# Patient Record
Sex: Male | Born: 1940 | Race: White | Hispanic: No | Smoking: Former smoker
Health system: Southern US, Community
[De-identification: ages and names within clinical notes are randomized; demographics above are authoritative.]

## PROBLEM LIST (undated history)

## (undated) DIAGNOSIS — I10 Essential (primary) hypertension: Secondary | ICD-10-CM

## (undated) DIAGNOSIS — B029 Zoster without complications: Secondary | ICD-10-CM

---

## 1998-09-27 ENCOUNTER — Encounter: Payer: Self-pay | Admitting: General Surgery

## 1998-09-29 ENCOUNTER — Ambulatory Visit (HOSPITAL_COMMUNITY): Admission: RE | Admit: 1998-09-29 | Discharge: 1998-09-29 | Payer: Self-pay | Admitting: General Surgery

## 2000-01-24 ENCOUNTER — Ambulatory Visit (HOSPITAL_BASED_OUTPATIENT_CLINIC_OR_DEPARTMENT_OTHER): Admission: RE | Admit: 2000-01-24 | Discharge: 2000-01-24 | Payer: Self-pay | Admitting: General Surgery

## 2009-11-24 ENCOUNTER — Encounter (INDEPENDENT_AMBULATORY_CARE_PROVIDER_SITE_OTHER): Payer: Self-pay | Admitting: *Deleted

## 2010-06-05 ENCOUNTER — Emergency Department: Payer: Self-pay | Admitting: Emergency Medicine

## 2010-06-06 ENCOUNTER — Ambulatory Visit: Payer: Self-pay | Admitting: Emergency Medicine

## 2010-06-18 ENCOUNTER — Emergency Department: Payer: Self-pay | Admitting: Emergency Medicine

## 2010-06-26 NOTE — Letter (Signed)
Summary: Colonoscopy Letter  [facility]  [address] [place], [place] [address]   Phone: [phone]  Fax: [phone]      November 24, 2009 MRN: [id number]   HONG MORING [address] [place], [place]  [address]   Dear Mr. Colavito,   According to your medical record, it is time for you to schedule a Colonoscopy. The American Cancer Society recommends this procedure as a method to detect early colon cancer. Patients with a family history of colon cancer, or a personal history of colon polyps or inflammatory bowel disease are at increased risk.  This letter has beeen generated based on the recommendations made at the time of your procedure. If you feel that in your particular situation this may no longer apply, please contact our office.  Please call our office at [phone] to schedule this appointment or to update your records at your earliest convenience.  Thank you for cooperating with [place] to provide you with the very best care possible.   Sincerely,  Judie Petit T. Russella Dar, M.D.  [facility] [phone]

## 2012-01-29 ENCOUNTER — Encounter: Payer: Self-pay | Admitting: Gastroenterology

## 2018-01-09 ENCOUNTER — Emergency Department: Payer: Medicare Other

## 2018-01-09 ENCOUNTER — Inpatient Hospital Stay
Admission: EM | Admit: 2018-01-09 | Discharge: 2018-01-10 | DRG: 180 | Disposition: A | Discharge status: Other Acute Inpatient Hospital | Payer: Medicare Other | Attending: Internal Medicine | Admitting: Internal Medicine

## 2018-01-09 ENCOUNTER — Encounter: Payer: Self-pay | Admitting: Emergency Medicine

## 2018-01-09 ENCOUNTER — Other Ambulatory Visit: Payer: Self-pay

## 2018-01-09 DIAGNOSIS — J9601 Acute respiratory failure with hypoxia: Secondary | ICD-10-CM | POA: Diagnosis present

## 2018-01-09 DIAGNOSIS — E43 Unspecified severe protein-calorie malnutrition: Secondary | ICD-10-CM | POA: Diagnosis present

## 2018-01-09 DIAGNOSIS — E871 Hypo-osmolality and hyponatremia: Secondary | ICD-10-CM | POA: Diagnosis not present

## 2018-01-09 DIAGNOSIS — E860 Dehydration: Secondary | ICD-10-CM | POA: Diagnosis present

## 2018-01-09 DIAGNOSIS — Z87891 Personal history of nicotine dependence: Secondary | ICD-10-CM

## 2018-01-09 DIAGNOSIS — J9811 Atelectasis: Secondary | ICD-10-CM | POA: Diagnosis present

## 2018-01-09 DIAGNOSIS — R918 Other nonspecific abnormal finding of lung field: Secondary | ICD-10-CM | POA: Diagnosis not present

## 2018-01-09 DIAGNOSIS — J440 Chronic obstructive pulmonary disease with acute lower respiratory infection: Secondary | ICD-10-CM | POA: Diagnosis present

## 2018-01-09 DIAGNOSIS — Z8619 Personal history of other infectious and parasitic diseases: Secondary | ICD-10-CM

## 2018-01-09 DIAGNOSIS — Z79899 Other long term (current) drug therapy: Secondary | ICD-10-CM | POA: Diagnosis not present

## 2018-01-09 DIAGNOSIS — D649 Anemia, unspecified: Secondary | ICD-10-CM | POA: Diagnosis present

## 2018-01-09 DIAGNOSIS — R0902 Hypoxemia: Secondary | ICD-10-CM

## 2018-01-09 DIAGNOSIS — R634 Abnormal weight loss: Secondary | ICD-10-CM | POA: Diagnosis not present

## 2018-01-09 DIAGNOSIS — C3411 Malignant neoplasm of upper lobe, right bronchus or lung: Secondary | ICD-10-CM | POA: Diagnosis present

## 2018-01-09 DIAGNOSIS — J189 Pneumonia, unspecified organism: Secondary | ICD-10-CM | POA: Diagnosis present

## 2018-01-09 DIAGNOSIS — R042 Hemoptysis: Secondary | ICD-10-CM | POA: Diagnosis not present

## 2018-01-09 DIAGNOSIS — Z681 Body mass index (BMI) 19 or less, adult: Secondary | ICD-10-CM

## 2018-01-09 DIAGNOSIS — I10 Essential (primary) hypertension: Secondary | ICD-10-CM | POA: Diagnosis present

## 2018-01-09 HISTORY — DX: Zoster without complications: B02.9

## 2018-01-09 HISTORY — DX: Essential (primary) hypertension: I10

## 2018-01-09 LAB — BLOOD GAS, VENOUS
Acid-base deficit: 3.7 mmol/L — ABNORMAL HIGH (ref 0.0–2.0)
Bicarbonate: 20.6 mmol/L (ref 20.0–28.0)
O2 Saturation: 68.3 %
PCO2 VEN: 34 mmHg — AB (ref 44.0–60.0)
PO2 VEN: 36 mmHg (ref 32.0–45.0)
Patient temperature: 37
pH, Ven: 7.39 (ref 7.250–7.430)

## 2018-01-09 LAB — COMPREHENSIVE METABOLIC PANEL
ALBUMIN: 4.1 g/dL (ref 3.5–5.0)
ALK PHOS: 110 U/L (ref 38–126)
ALT: 28 U/L (ref 0–44)
AST: 30 U/L (ref 15–41)
Anion gap: 9 (ref 5–15)
BILIRUBIN TOTAL: 1.3 mg/dL — AB (ref 0.3–1.2)
BUN: 18 mg/dL (ref 8–23)
CALCIUM: 9.7 mg/dL (ref 8.9–10.3)
CO2: 25 mmol/L (ref 22–32)
Chloride: 93 mmol/L — ABNORMAL LOW (ref 98–111)
Creatinine, Ser: 1.08 mg/dL (ref 0.61–1.24)
GFR calc Af Amer: >60 mL/min (ref >60)
GFR calc non Af Amer: >60 mL/min (ref >60)
GLUCOSE: 118 mg/dL — AB (ref 70–99)
Potassium: 4.5 mmol/L (ref 3.5–5.1)
Sodium: 127 mmol/L — ABNORMAL LOW (ref 135–145)
Total Protein: 7.7 g/dL (ref 6.5–8.1)

## 2018-01-09 LAB — CBC WITH DIFFERENTIAL/PLATELET
Basophils Absolute: 0 10*3/uL (ref 0–0.1)
Basophils Relative: 0 %
Eosinophils Absolute: 0 10*3/uL (ref 0–0.7)
Eosinophils Relative: 0 %
HCT: 35.3 % — ABNORMAL LOW (ref 40.0–52.0)
Hemoglobin: 12.4 g/dL — ABNORMAL LOW (ref 13.0–18.0)
LYMPHS ABS: 0.3 10*3/uL — AB (ref 1.0–3.6)
LYMPHS PCT: 3 %
MCH: 31.4 pg (ref 26.0–34.0)
MCHC: 35.2 g/dL (ref 32.0–36.0)
MCV: 89.3 fL (ref 80.0–100.0)
MONO ABS: 1 10*3/uL (ref 0.2–1.0)
MONOS PCT: 13 %
Neutro Abs: 6.6 10*3/uL — ABNORMAL HIGH (ref 1.4–6.5)
Neutrophils Relative %: 84 %
PLATELETS: 262 10*3/uL (ref 150–440)
RBC: 3.95 MIL/uL — ABNORMAL LOW (ref 4.40–5.90)
RDW: 13.6 % (ref 11.5–14.5)
WBC: 7.9 10*3/uL (ref 3.8–10.6)

## 2018-01-09 LAB — URINALYSIS, COMPLETE (UACMP) WITH MICROSCOPIC
Bacteria, UA: NONE SEEN
Bilirubin Urine: NEGATIVE
Glucose, UA: >=500 mg/dL — AB
Hgb urine dipstick: NEGATIVE
Ketones, ur: 20 mg/dL — AB
Leukocytes, UA: NEGATIVE
Nitrite: NEGATIVE
PH: 6 (ref 5.0–8.0)
Protein, ur: NEGATIVE mg/dL
SQUAMOUS EPITHELIAL / LPF: NONE SEEN (ref 0–5)
Specific Gravity, Urine: 1.028 (ref 1.005–1.030)

## 2018-01-09 LAB — BRAIN NATRIURETIC PEPTIDE: B Natriuretic Peptide: 310 pg/mL — ABNORMAL HIGH (ref 0.0–100.0)

## 2018-01-09 LAB — PROCALCITONIN: Procalcitonin: <0.1 ng/mL

## 2018-01-09 LAB — LACTIC ACID, PLASMA: Lactic Acid, Venous: 1.1 mmol/L (ref 0.5–1.9)

## 2018-01-09 LAB — TROPONIN I: Troponin I: <0.03 ng/mL (ref <0.03)

## 2018-01-09 LAB — MRSA PCR SCREENING: MRSA BY PCR: NEGATIVE

## 2018-01-09 LAB — STREP PNEUMONIAE URINARY ANTIGEN: Strep Pneumo Urinary Antigen: NEGATIVE

## 2018-01-09 LAB — MAGNESIUM: Magnesium: 1.9 mg/dL (ref 1.7–2.4)

## 2018-01-09 MED ORDER — ALBUTEROL SULFATE (2.5 MG/3ML) 0.083% IN NEBU: 2.5000 mg | INHALATION_SOLUTION | RESPIRATORY_TRACT | Status: DC | PRN

## 2018-01-09 MED ORDER — IPRATROPIUM-ALBUTEROL 0.5-2.5 (3) MG/3ML IN SOLN
3.0000 mL | Freq: Four times a day (QID) | RESPIRATORY_TRACT | Status: DC
  Administered 2018-01-09 (×2): 3 mL via RESPIRATORY_TRACT
  Filled 2018-01-09 (×2): qty 3

## 2018-01-09 MED ORDER — ALBUTEROL SULFATE (2.5 MG/3ML) 0.083% IN NEBU
2.5000 mg | INHALATION_SOLUTION | Freq: Once | RESPIRATORY_TRACT | Status: AC
  Administered 2018-01-09: 2.5 mg via RESPIRATORY_TRACT
  Filled 2018-01-09: qty 3

## 2018-01-09 MED ORDER — ONDANSETRON HCL 4 MG/2ML IJ SOLN: 4.0000 mg | Freq: Four times a day (QID) | INTRAMUSCULAR | Status: DC | PRN

## 2018-01-09 MED ORDER — SODIUM CHLORIDE 0.9% FLUSH: 3.0000 mL | INTRAVENOUS | Status: DC | PRN

## 2018-01-09 MED ORDER — MORPHINE SULFATE (PF) 4 MG/ML IV SOLN
4.0000 mg | Freq: Once | INTRAVENOUS | Status: AC
  Administered 2018-01-09: 4 mg via INTRAVENOUS
  Filled 2018-01-09: qty 1

## 2018-01-09 MED ORDER — BISACODYL 5 MG PO TBEC: 5.0000 mg | DELAYED_RELEASE_TABLET | Freq: Every day | ORAL | Status: DC | PRN

## 2018-01-09 MED ORDER — ENOXAPARIN SODIUM 40 MG/0.4ML ~~LOC~~ SOLN: 40.0000 mg | SUBCUTANEOUS | Status: DC

## 2018-01-09 MED ORDER — VANCOMYCIN HCL IN DEXTROSE 1-5 GM/200ML-% IV SOLN
1000.0000 mg | Freq: Once | INTRAVENOUS | Status: AC
  Administered 2018-01-09: 1000 mg via INTRAVENOUS
  Filled 2018-01-09: qty 200

## 2018-01-09 MED ORDER — SODIUM CHLORIDE 0.9 % IV SOLN
1.0000 g | INTRAVENOUS | Status: DC
  Filled 2018-01-09: qty 10

## 2018-01-09 MED ORDER — SODIUM CHLORIDE 0.9 % IV SOLN
INTRAVENOUS | Status: DC
  Administered 2018-01-09: 16:00:00 via INTRAVENOUS

## 2018-01-09 MED ORDER — ORAL CARE MOUTH RINSE: 15.0000 mL | Freq: Two times a day (BID) | OROMUCOSAL | Status: DC

## 2018-01-09 MED ORDER — METHYLPREDNISOLONE SODIUM SUCC 125 MG IJ SOLR
60.0000 mg | Freq: Two times a day (BID) | INTRAMUSCULAR | Status: DC
  Administered 2018-01-09: 60 mg via INTRAVENOUS
  Filled 2018-01-09: qty 2

## 2018-01-09 MED ORDER — ACETAMINOPHEN 325 MG PO TABS: 650.0000 mg | ORAL_TABLET | Freq: Four times a day (QID) | ORAL | Status: DC | PRN

## 2018-01-09 MED ORDER — ACETAMINOPHEN 650 MG RE SUPP: 650.0000 mg | Freq: Four times a day (QID) | RECTAL | Status: DC | PRN

## 2018-01-09 MED ORDER — ONDANSETRON HCL 4 MG PO TABS: 4.0000 mg | ORAL_TABLET | Freq: Four times a day (QID) | ORAL | Status: DC | PRN

## 2018-01-09 MED ORDER — SENNOSIDES-DOCUSATE SODIUM 8.6-50 MG PO TABS: 1.0000 | ORAL_TABLET | Freq: Every evening | ORAL | Status: DC | PRN

## 2018-01-09 MED ORDER — SODIUM CHLORIDE 0.9% FLUSH
3.0000 mL | Freq: Two times a day (BID) | INTRAVENOUS | Status: DC
  Administered 2018-01-09 (×2): 3 mL via INTRAVENOUS

## 2018-01-09 MED ORDER — GUAIFENESIN 100 MG/5ML PO SOLN
5.0000 mL | ORAL | Status: DC | PRN
  Filled 2018-01-09: qty 5

## 2018-01-09 MED ORDER — IOHEXOL 300 MG/ML  SOLN
75.0000 mL | Freq: Once | INTRAMUSCULAR | Status: AC | PRN
  Administered 2018-01-09: 75 mL via INTRAVENOUS

## 2018-01-09 MED ORDER — ENOXAPARIN SODIUM 40 MG/0.4ML ~~LOC~~ SOLN
40.0000 mg | SUBCUTANEOUS | Status: DC
  Administered 2018-01-09: 40 mg via SUBCUTANEOUS
  Filled 2018-01-09: qty 0.4

## 2018-01-09 MED ORDER — SODIUM CHLORIDE 0.9 % IV SOLN
1.0000 g | Freq: Once | INTRAVENOUS | Status: AC
  Administered 2018-01-09: 1 g via INTRAVENOUS
  Filled 2018-01-09: qty 10

## 2018-01-09 MED ORDER — SODIUM CHLORIDE 0.9 % IV SOLN: 250.0000 mL | INTRAVENOUS | Status: DC | PRN

## 2018-01-09 MED ORDER — HYDROCODONE-ACETAMINOPHEN 5-325 MG PO TABS: 1.0000 | ORAL_TABLET | ORAL | Status: DC | PRN

## 2018-01-09 MED ORDER — SODIUM CHLORIDE 0.9 % IV SOLN
500.0000 mg | Freq: Once | INTRAVENOUS | Status: AC
  Administered 2018-01-09: 500 mg via INTRAVENOUS
  Filled 2018-01-09: qty 500

## 2018-01-09 MED ORDER — SODIUM CHLORIDE 0.9 % IV SOLN
500.0000 mg | INTRAVENOUS | Status: DC
  Filled 2018-01-09: qty 500

## 2018-01-09 MED ORDER — BUDESONIDE 0.5 MG/2ML IN SUSP
0.5000 mg | Freq: Two times a day (BID) | RESPIRATORY_TRACT | Status: DC
  Administered 2018-01-09: 0.5 mg via RESPIRATORY_TRACT
  Filled 2018-01-09: qty 2

## 2018-01-09 NOTE — H&P (Addendum)
[place] [facility] [place] NAME: Aaron Flores    MR#:  [id number]  DATE OF BIRTH:  1941-04-30  DATE OF ADMISSION:  01/09/2018  PRIMARY CARE PHYSICIAN: Patient, No Pcp Per   REQUESTING/REFERRING PHYSICIAN: Arta Silence, MD  CHIEF COMPLAINT:   Chief Complaint  Patient presents with  . Shortness of Breath   Worsening shortness of breath, cough and hemoptysis for 2 weeks. HISTORY OF PRESENT ILLNESS:  Aaron Flores  is a 77 y.o. male with a known history of hypertension.  The patient has had shortness of breath, cough and hematemesis for 1 months, worsening for the past 2 weeks and even worse for 2 days.  He also complains of wheezing, weight loss and poor oral intake.  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  He is found respiratory distress with hypoxia and put on high flow oxygen.  O2 saturation is a still low at 70 to 80s.  CT of the chest are so big lung mass 7.8 cm with obstructive pneumonia. PAST MEDICAL HISTORY:   Past Medical History:  Diagnosis Date  . HTN (hypertension)   . Shingles     PAST SURGICAL HISTORY:  History reviewed. No pertinent surgical history.  No surgical history.  SOCIAL HISTORY:   Social History   Tobacco Use  . Smoking status: Former Research scientist (life sciences)  . Smokeless tobacco: Never Used  Substance Use Topics  . Alcohol use: Not Currently    FAMILY HISTORY:  No family history on file.  No family history of hypertension, diabetes, heart attack or stroke and cancer.  DRUG ALLERGIES:  No Known Allergies  REVIEW OF SYSTEMS:   Review of Systems  Constitutional: Positive for malaise/fatigue. Negative for chills and fever.       Weight loss and poor oral intake.  HENT: Negative for sore throat.   Eyes: Negative for blurred vision and double vision.  Respiratory: Positive for cough, hemoptysis, sputum production, shortness of breath and wheezing. Negative for stridor.   Cardiovascular:  Positive for chest pain. Negative for palpitations, orthopnea and leg swelling.       Right-sided chest pain.  Gastrointestinal: Negative for abdominal pain, blood in stool, diarrhea, melena, nausea and vomiting.  Genitourinary: Negative for dysuria, flank pain and hematuria.  Musculoskeletal: Negative for back pain and joint pain.  Skin: Negative for rash.  Neurological: Negative for dizziness, sensory change, focal weakness, seizures, loss of consciousness, weakness and headaches.  Endo/Heme/Allergies: Negative for polydipsia.  Psychiatric/Behavioral: Negative for depression. The patient is not nervous/anxious.     MEDICATIONS AT HOME:   Prior to Admission medications   Medication Sig Start Date End Date Taking? Authorizing Provider  lisinopril (PRINIVIL,ZESTRIL) 10 MG tablet Take 10 mg by mouth daily. 11/14/17  Yes [provider]  Multiple Vitamins-Minerals (CENTRUM SILVER 50+MEN) TABS Take 1 tablet by mouth daily.   Yes [provider]      VITAL SIGNS:  Blood pressure 113/70, pulse 72, temperature (!) 96 F (35.6 C), temperature source Axillary, resp. rate (!) 26, height 5\' 9"  (1.753 m), weight 58.8 kg, SpO2 (!) 83 %.  PHYSICAL EXAMINATION:  Physical Exam  GENERAL:  77 y.o.-year-old patient lying in the bed with no acute distress.  Severe malnutrition. EYES: Pupils equal, round, reactive to light and accommodation. No scleral icterus. Extraocular muscles intact.  HEENT: Head atraumatic, normocephalic. Oropharynx and nasopharynx clear.  NECK:  Supple, no jugular venous distention. No thyroid enlargement, no tenderness.  LUNGS: Diminished breath sounds on the right side, no wheezing, rales,rhonchi or crepitation. No use of accessory muscles of respiration.  CARDIOVASCULAR: S1, S2 normal. No murmurs, rubs, or gallops.  ABDOMEN: Soft, nontender, nondistended. Bowel sounds present. No organomegaly or mass.  EXTREMITIES: No pedal edema, cyanosis, or clubbing.    NEUROLOGIC: Cranial nerves II through XII are intact. Muscle strength 4/5 in all extremities. Sensation intact. Gait not checked.  PSYCHIATRIC: The patient is alert and oriented x 3.  SKIN: No obvious rash, lesion, or ulcer.   LABORATORY PANEL:   CBC Recent Labs  Lab 01/09/18 0918  WBC 7.9  HGB 12.4*  HCT 35.3*  PLT 262   ------------------------------------------------------------------------------------------------------------------  Chemistries  Recent Labs  Lab 01/09/18 0918  NA 127*  K 4.5  CL 93*  CO2 25  GLUCOSE 118*  BUN 18  CREATININE 1.08  CALCIUM 9.7  AST 30  ALT 28  ALKPHOS 110  BILITOT 1.3*   ------------------------------------------------------------------------------------------------------------------  Cardiac Enzymes Recent Labs  Lab 01/09/18 0918  TROPONINI <0.03   ------------------------------------------------------------------------------------------------------------------  RADIOLOGY:  Ct Chest W Contrast  Result Date: 01/09/2018 CLINICAL DATA:  Shortness of breath, hemoptysis EXAM: CT CHEST WITH CONTRAST TECHNIQUE: Multidetector CT imaging of the chest was performed during intravenous contrast administration. CONTRAST:  63mL OMNIPAQUE IOHEXOL 300 MG/ML  SOLN COMPARISON:  01/09/2018 plain film FINDINGS: Cardiovascular: Aneurysmal dilatation of the ascending thoracic aorta, 4.2 cm. Tortuosity of the thoracic aorta. Scattered atherosclerosis. Heart is borderline in size. Extensive coronary artery calcifications. Mediastinum/Nodes: No visible hilar, axillary or mediastinal adenopathy. Lungs/Pleura: There is a large mass in the right upper lobe which extends from the lateral pleural surface to the right hilum with extension into the right mainstem bronchus with partial obstruction of the right mainstem bronchus and bronchus intermedius. This mass measures approximately 7.8 by 6.0 x 5.3 cm and is most concerning for primary lung cancer. Extensive  airspace disease throughout the right lower lobe and right middle lobe, likely postobstructive pneumonia. Airspace disease also surrounds the large mass in the right upper lobe, also likely pneumonia. Volume loss on the right. No confluent opacity or effusion on the left. Moderate emphysema. Elongated noncalcified pleural plaque noted posteriorly on the left with a craniocaudal length of 3.7 cm seen on image 103 of series 5, coronal imaging. Upper Abdomen: Imaging into the upper abdomen shows no acute findings. Musculoskeletal: Chest wall soft tissues are unremarkable. No acute bony abnormality. IMPRESSION: Large right upper lobe mass measuring up to 7.8 cm extending from the lateral pleural surface to the right hilum most compatible with primary lung cancer. Extensive airspace disease surrounds the mass in the right upper lobe and also noted in the right lower lobe and right middle lobe, likely pneumonia. The mass extends into the right mainstem bronchus with partial occlusion of the right mainstem bronchus and bronchus intermedius. Dilated gas filled esophagus. 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 Recommend annual imaging followup by CTA or MRA. This recommendation follows 2010 ACCF/AHA/AATS/ACR/ASA/SCA/SCAI/SIR/STS/SVM Guidelines for the Diagnosis and Management of Patients with Thoracic Aortic Disease. Circulation. 2010; 121: R154-M086 Coronary artery disease. Aortic Atherosclerosis (ICD10-I70.0) and Emphysema (ICD10-J43.9). Electronically Signed   By: Rolm Baptise M.D.   On: 01/09/2018 10:50   Dg Chest Portable 1 View  Result Date: 01/09/2018 CLINICAL DATA:  78 year old with shortness of breath and hemoptysis. Former smoker. EXAM: PORTABLE CHEST 1 VIEW COMPARISON:  Chest radiograph report from 12/12/2002. Images not available. FINDINGS: There is marked volume loss in the right hemithorax with mediastinal  shift towards the right. Poorly defined opacity in the right hilum and right upper lung. Some  patchy densities at the right lung base. Hyperinflation of the left lung. Fullness along the right side of the mediastinum but difficult to characterize due to the mediastinal shift. Heart size appears to be within normal limits. Bone structures are unremarkable. IMPRESSION: Markedly abnormal chest radiograph with extensive volume loss in the right hemithorax and patchy disease throughout the right lung. There is a poorly defined large opacity in the right upper lung region and concerning for a lung mass. Recommend further characterization with a chest CT with IV contrast. Electronically Signed   By: Markus Daft M.D.   On: 01/09/2018 09:34      IMPRESSION AND PLAN:   Acute respiratory failure with hypoxia due to lung mass and obstructive pneumonia. The patient will be admitted to stepdown unit. Continue high flow, BiPAP as needed.  Follow-up with Dr. Jefferson Fuel for possible intubation. Continue IV antibiotics, DuoNeb every 6 hours, Robitussin as needed.  Lung mass with hemoptysis. Follow-up Dr. Jefferson Fuel.  Need to biopsy when patient is stable.  Hyponatremia.  Start normal saline IV and follow-up BMP.  Severe malnutrition.  Dietitian consult. Discussed with Dr. Jefferson Fuel. All the records are reviewed and case discussed with ED provider. Management plans discussed with the patient, his son and daughter and they are in agreement.  CODE STATUS: Full code  TOTAL TIME TAKING CARE OF THIS PATIENT: 48 minutes.    Demetrios Loll M.D on 01/09/2018 at 1:24 PM  Between 7am to 6pm - Pager - [phone]  After 6pm go to [email or web address] - Proofreader  [facility]  Office  [phone]  CC: Primary care physician; Patient, No Pcp Per   Note: This dictation was prepared with Dragon dictation along with smaller phrase technology. Any transcriptional errors that result from this process are unin

## 2018-01-09 NOTE — Consult Note (Addendum)
PULMONARY / CRITICAL CARE MEDICINE   Name: Aaron Flores MRN: [id number] DOB: 1941-04-15    ADMISSION DATE:  01/09/2018 CONSULTATION DATE:  01/09/2018  REFERRING MD:  Aaron Flores  CHIEF COMPLAINT:  Shortness of Breath  HISTORY OF PRESENT ILLNESS:   Aaron Flores is a 77 y.o. Male with a PMH of Shingles and HTN, who presents to [facility] ED on 01/09/18 with c/o Shortness of Breath and associated Hemoptysis.  Pt reports that his SOB has been progressive for the last several months, which worsened today prompting him to seek evaluation at ED. He reports that he has been experiencing hemoptysis intermittingly for 2 weeks, with right Pleuritic chest pain worse with deep inspiration.  He denies fever/chills, sick contacts, swelling, or bone pain.  He does report weight loss of approximately 15 pounds in the last few months, having very poor po intake due to decreased appetite.  He reports that he worked in the [employer], was a smoker for approxametly 30 years, smoking 2 packs at his heaviest, quitting 5 years ago. Initial evaluation in the ED revealed: CXR w/ extensive opacities throughout the R lung and a RUL mass.  Na 127, WBC 7.9, BNP 310, Lactic 1.1, and Troponin<0.03.  CT Chest w/ contrast obtained noting Large RUL mass that extends into R mainstem bronchus w/ partial occlusion of the R mainstem bronchus and R bronchus intermedius, and PNA throughout R lung.  He is being admitted to [place] unit for further treatment of Acute Hypoxic Respiratory Failure in setting of CAP and RUL mass.  PCCM is consulted for further management  PAST MEDICAL HISTORY :  He  has a past medical history of Shingles.  PAST SURGICAL HISTORY: He  has no past surgical history on file.  No Known Allergies  No current facility-administered medications on file prior to encounter.    Current Outpatient Medications on File Prior to Encounter  Medication Sig  . lisinopril (PRINIVIL,ZESTRIL) 10 MG tablet Take 10  mg by mouth daily.  . Multiple Vitamins-Minerals (CENTRUM SILVER 50+MEN) TABS Take 1 tablet by mouth daily.    FAMILY HISTORY:  His family history is not on file.  SOCIAL HISTORY: He  reports that he has quit smoking. He has never used smokeless tobacco. He reports that he drank alcohol. He reports that he has current or past drug history.  REVIEW OF SYSTEMS:   Positives in BOLD: Gen: Denies fever, chills, weight change, fatigue, night sweats HEENT: Denies blurred vision, double vision, hearing loss, tinnitus, sinus congestion, rhinorrhea, sore throat, neck stiffness, dysphagia PULM: Denies +shortness of breath, cough, sputum production, +hemoptysis, wheezing CV: Denies chest pain, edema, orthopnea, paroxysmal nocturnal dyspnea, palpitations GI: Denies abdominal pain, nausea, vomiting, diarrhea, hematochezia, melena, constipation, change in bowel habits GU: Denies dysuria, hematuria, polyuria, oliguria, urethral discharge Endocrine: Denies hot or cold intolerance, polyuria, polyphagia or appetite change Derm: Denies rash, dry skin, scaling or peeling skin change Heme: Denies easy bruising, bleeding, bleeding gums Neuro: Denies headache, numbness, weakness, slurred speech, loss of memory or consciousness   SUBJECTIVE:  Pt reports SOB, which has improved since admission Currently on 100% HFNC Comfortable on HFNC, denies chest pain Afebrile  VITAL SIGNS: BP 113/70 (BP Location: Right Arm)   Pulse 72   Temp (!) 96 F (35.6 C) (Axillary)   Resp (!) 26   Ht 5\' 9"  (1.753 m)   Wt 58.8 kg   SpO2 (!) 83%   BMI 19.15 kg/m   HEMODYNAMICS:    VENTILATOR  SETTINGS: FiO2 (%):  [98 %] 98 %  INTAKE / OUTPUT: No intake/output data recorded.  PHYSICAL EXAMINATION: General:  Acutely ill appearing male, sitting in bed, on 100% HFNC, w/ mild respiratory distress Neuro:  Awake, A&Ox4, Follows commands, no focal deficits HEENT:  Atraumatic, normocephalic, neck supple, No  JVD Cardiovascular:  RRR, s1s2, no M/R/G, 2+ pulses throughout Lungs:  Tachypnea, even, mild assessory muscle use, clear on left, diminished R middle and R Lower lobes Abdomen:  Soft, nondistended, nontender, BS+ x4 Musculoskeletal:  No deformities, normal bulk and tone Skin:  Warm/dry, no obvious rashes, lesions, or ulcerations.  LABS:  BMET Recent Labs  Lab 01/09/18 0918  NA 127*  K 4.5  CL 93*  CO2 25  BUN 18  CREATININE 1.08  GLUCOSE 118*    Electrolytes Recent Labs  Lab 01/09/18 0918  CALCIUM 9.7    CBC Recent Labs  Lab 01/09/18 0918  WBC 7.9  HGB 12.4*  HCT 35.3*  PLT 262    Coag's No results for input(s): APTT, INR in the last 168 hours.  Sepsis Markers Recent Labs  Lab 01/09/18 0904  LATICACIDVEN 1.1    ABG No results for input(s): PHART, PCO2ART, PO2ART in the last 168 hours.  Liver Enzymes Recent Labs  Lab 01/09/18 0918  AST 30  ALT 28  ALKPHOS 110  BILITOT 1.3*  ALBUMIN 4.1    Cardiac Enzymes Recent Labs  Lab 01/09/18 0918  TROPONINI <0.03    Glucose No results for input(s): GLUCAP in the last 168 hours.  Imaging Ct Chest W Contrast  Result Date: 01/09/2018 CLINICAL DATA:  Shortness of breath, hemoptysis EXAM: CT CHEST WITH CONTRAST TECHNIQUE: Multidetector CT imaging of the chest was performed during intravenous contrast administration. CONTRAST:  20mL OMNIPAQUE IOHEXOL 300 MG/ML  SOLN COMPARISON:  01/09/2018 plain film FINDINGS: Cardiovascular: Aneurysmal dilatation of the ascending thoracic aorta, 4.2 cm. Tortuosity of the thoracic aorta. Scattered atherosclerosis. Heart is borderline in size. Extensive coronary artery calcifications. Mediastinum/Nodes: No visible hilar, axillary or mediastinal adenopathy. Lungs/Pleura: There is a large mass in the right upper lobe which extends from the lateral pleural surface to the right hilum with extension into the right mainstem bronchus with partial obstruction of the right mainstem  bronchus and bronchus intermedius. This mass measures approximately 7.8 by 6.0 x 5.3 cm and is most concerning for primary lung cancer. Extensive airspace disease throughout the right lower lobe and right middle lobe, likely postobstructive pneumonia. Airspace disease also surrounds the large mass in the right upper lobe, also likely pneumonia. Volume loss on the right. No confluent opacity or effusion on the left. Moderate emphysema. Elongated noncalcified pleural plaque noted posteriorly on the left with a craniocaudal length of 3.7 cm seen on image 103 of series 5, coronal imaging. Upper Abdomen: Imaging into the upper abdomen shows no acute findings. Musculoskeletal: Chest wall soft tissues are unremarkable. No acute bony abnormality. IMPRESSION: Large right upper lobe mass measuring up to 7.8 cm extending from the lateral pleural surface to the right hilum most compatible with primary lung cancer. Extensive airspace disease surrounds the mass in the right upper lobe and also noted in the right lower lobe and right middle lobe, likely pneumonia. The mass extends into the right mainstem bronchus with partial occlusion of the right mainstem bronchus and bronchus intermedius. Dilated gas filled esophagus. 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 Recommend annual imaging followup by CTA or MRA. This recommendation follows 2010 ACCF/AHA/AATS/ACR/ASA/SCA/SCAI/SIR/STS/SVM Guidelines for the Diagnosis and Management  of Patients with Thoracic Aortic Disease. Circulation. 2010; 121: B716-R678 Coronary artery disease. Aortic Atherosclerosis (ICD10-I70.0) and Emphysema (ICD10-J43.9). Electronically Signed   By: Rolm Baptise M.D.   On: 01/09/2018 10:50   Dg Chest Portable 1 View  Result Date: 01/09/2018 CLINICAL DATA:  77 year old with shortness of breath and hemoptysis. Former smoker. EXAM: PORTABLE CHEST 1 VIEW COMPARISON:  Chest radiograph report from 12/12/2002. Images not available. FINDINGS: There is marked  volume loss in the right hemithorax with mediastinal shift towards the right. Poorly defined opacity in the right hilum and right upper lung. Some patchy densities at the right lung base. Hyperinflation of the left lung. Fullness along the right side of the mediastinum but difficult to characterize due to the mediastinal shift. Heart size appears to be within normal limits. Bone structures are unremarkable. IMPRESSION: Markedly abnormal chest radiograph with extensive volume loss in the right hemithorax and patchy disease throughout the right lung. There is a poorly defined large opacity in the right upper lung region and concerning for a lung mass. Recommend further characterization with a chest CT with IV contrast. Electronically Signed   By: Markus Daft M.D.   On: 01/09/2018 09:34     STUDIES:  CT Chest w/ Contrast 01/09/18>> Large right upper lobe mass measuring up to 7.8 cm extending fromthe lateral pleural surface to the right hilum most compatible with primary lung cancer. Extensive airspace disease surrounds the mass in the right upper lobe and also noted in the right lower lobe and right middle lobe, likely pneumonia. The mass extends into the right mainstem bronchus with partial occlusion of the right mainstem bronchus and bronchus intermedius.  CULTURES: Blood x2 01/09/18>> Sputum 01/09/18>> Strep pneumo urinary antigen 01/09/18>> Legionella urinary antigen 01/09/18>>  ANTIBIOTICS: Rocephin 01/09/18>>01/09/18 Azithromycin 01/09/18>>01/09/18 Vancomycin 01/09/18>> Unasyn 01/09/18>>  SIGNIFICANT EVENTS: 01/09/18>>Admission to [facility] ICU; PCCM consulted  LINES/TUBES:   DISCUSSION: 77 y.o. Admitted with Acute Hypoxic Respiratory failure in setting of CAP & large RUL mass (concerning for primary lung cancer).  ASSESSMENT / PLAN:  PULMONARY A: Acute Hypoxic Respiratory Failure in setting of CAP & RUL Lung Mass P:   Supplemental O2, titrate as able to maintain O2 sats>92% HFNC, BiPAP if  needed Follow cultures Abx as above Follow intermittent CXR Scheduled Bronchodilators & ICS IV Solu-Medrol 60 mg q12h Oncology consulted, appreciate input Spoke with Dr. Donella Stade of Radiation Oncology, recommends that pt be transferred>> Dr. Jefferson Fuel has  Reached out to [place]:  Elevated BNP -BNP 310 Hx: HTN P:  Cardiac monitoring Trend Troponin Maintain MAP >65  RENAL A:   Hyponatremia, likely secondary to Dehydration P:   Monitor I&O's / urinary output Follow BMP Ensure adequate renal perfusion Avoid nephrotoxic agents as able Replace electrolytes as indicated NS @ 75 ml/hr   GASTROINTESTINAL A:   No active issues P:   Keep NPO for now given Respiratory status  HEMATOLOGIC A:   Anemia without signs of bleeding P:  Monitor for s/sx of bleeding Trend CBC Lovenox for VTE prophylaxis Transfuse for Hbg<7  INFECTIOUS A:   CAP P:   Monitor fever curve Trend WBC & PCT Follow cultures Abx as above  ENDOCRINE A:   No active issues  P:   Follow Glucose on BMP  NEUROLOGIC A:   No active issues P:   Provide supportive care Lights on during the day Promote normal wake/sleep cycle Avoid sedating meds as able   FAMILY  - Updates: Pt updated at  bedside, no family present at bedside 8/16 during NP rounds.  - Inter-disciplinary family meet or Palliative Care meeting due by:  01/16/18    Aaron Flores, Aaron Flores [facility] Pager: [phone]   01/09/2018, 1:18 PM  Pulmonary/critical care attending  I have personally seen and examined patient, reviewed revise and confirm nurse practitioner's note. I panel performed history, physical, reviewed all laboratory and imaging studies. In short Aaron Flores is a 77 y.o. Male with a PMH of Shingles and HTN, who [facility] on [facility]09[facility]18 with c/o Shortness of Breath and associated Hemoptysis.  Pt reports that his SOB has been progressive for  the last several months, which worsened today prompting him to seek evaluation at ED. He reports that he has been experiencing hemoptysis intermittingly for 2 weeks, with right Pleuritic chest pain worse with deep inspiration.  He denies fever/chills, sick contacts, swelling, or bone pain.  He does report weight loss of approximately 15 pounds in the last few months, having very poor po intake due to decreased appetite.  He reports that he worked in the [employer], was a smoker for approxametly 30 years, smoking 2 packs at his heaviest, quitting 5 years ago.  Workup has included CT scan and chest x-ray. Chest x-ray was remarkable for tracheal deviation towards the right with right middle and right lower lobe opacities in a large lung mass. CT scan of the chest shows the same. There is an 7.8 cm lesion encroaching into the right mainstem bronchus with an exophytic component resulting in complete opacification of right mainstem/bronchus intermedius. I don't clearly see a right upper lobe orifice but there looks to be some retained right upper lobe air. The right middle and right lower lobe are most certainly collapsed with complete luminal opacification. There is parenchymal densities in the right middle and right lower lung distribution which most likely results postobstructive atelectasis. Concerning to me is in the right upper lung zone there are lymphangitic prominence concerning for lymphangitic spread of malignancy. Patient's sodium is 127 Normal calcium.  Patient is going to need a multimodality approach to treatment of his malignancy. He will most likely need some form of airway debulking technique to include APC/ND YAG/possible rigid debulking/possible endoluminal stent/endoluminal radiation. He will also need combined radiation and chemotherapy approach. We don't have those capabilities at this facility and [facility] has been kind to accept this patient in transfer. Presently he is saturating 97% on  high flow oxygen and is resting comfortably. He is on Solu-Medrol, bronchodilators and broad-spectrum anabiotic coverage.  Aaron Flores, D.O.

## 2018-01-09 NOTE — ED Notes (Signed)
Pt given some water per request -- ok per MD

## 2018-01-09 NOTE — ED Notes (Signed)
RT assisted RN transport patient to ICU with no complications. Patient was transported on NRB to unit and placed back on [place].

## 2018-01-09 NOTE — Progress Notes (Signed)
Pharmacy Antibiotic Note  Aaron Flores is a 77 y.o. male admitted on 01/09/2018 with shortness of breath, cough, and hemoptysis for 2 weeks. Patient presents with acute respiratory failure with hypoxia due to lung mass and obstructive pneumonia. Pharmacy has been consulted for Unasyn and Vancomycin dosing.  Plan: Patient has pneumonia and qualifies for broad spectrum antibiotic such as Unasyn. Patient will start Unasyn IV 3g q 6 hours.   Patient received Vancomycin 1g in ED, per stacked dosing. Will initiate Vancomycin 1g q 24 hours with next scheduled dose at 2200. Goal trough: 15-20. Will follow MRSA PCR and serum creatinine and obtain trough as clinically indicated.  Height: 5\' 9"  (175.3 cm) Weight: 119 lb 11.4 oz (54.3 kg) IBW/kg (Calculated) : 70.7  Temp (24hrs), Avg:96 F (35.6 C), Min:96 F (35.6 C), Max:96 F (35.6 C)  Recent Labs  Lab 01/09/18 0904 01/09/18 0918  WBC  --  7.9  CREATININE  --  1.08  LATICACIDVEN 1.1  --     Estimated Creatinine Clearance: 46.1 mL/min (by C-G formula based on SCr of 1.08 mg/dL).    No Known Allergies  Antimicrobials this admission: Azithromycin: 08/16x1 Ceftriaxone: 08/16x1 Vancomycin: 08/16>> Unasyn: 08/16>>  Microbiology results: BCx: Pending Sputum: Pending MRSA PCR: Pending  Thank you for allowing pharmacy to be a part of this patient's care.  Janey Greaser Colie Fugitt, PharmD Candidate 01/09/2018 3:06 PM

## 2018-01-09 NOTE — Consult Note (Signed)
Hematology/Oncology Consult note [facility] [phone] [phone]  Patient Care Team: Patient, No Pcp Per as PCP - General ([place]) Telford Nab, RN as Registered Nurse   Name of the patient: Aaron Flores  [id number]  Feb 12, 1941    Reason for consult: lung mass   Requesting physician: Dr. Bridgett Larsson  Date of visit: 01/09/2018    History of presenting illness-patient is a 77 year old male with a past medical history significant for hypertension.  He presented to the ER with symptoms of shortness of breath, hematemesis and cough which has been ongoing for the last 1 month but particularly worse over the last 2 weeks.  He has had a significant amount of weight loss.  Chest x-ray showed extensive volume loss in the right hemithorax and patchy disease throughout the right lung as well as a large opacity in the right upper lobe concerning for a lung mass.  This was followed by a CT of the chest with contrast which showed 7.8 x 6 x 5.3 cm mass in the right upper lobe extending from the lateral pleural surface to the right hilum with extension into the right mainstem bronchus with partial obstruction.  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  He is currently in the ICU with acute hypoxic respiratory failure on high flow oxygen.  He is also being empirically treated for pneumonia and is on IV antibiotics.  Patient lives alone in [place] and 2 of his children live in [place].  He was an ex-smoker and smoked for more than 40 years and quit smoking 5 years ago.  He has lost about 30 pounds of weight over the last year.Reports poor appetite.  Denies any pain   Pain scale- 0   Review of systems- Review of Systems  Constitutional: Positive for malaise/fatigue and weight loss. Negative for chills and fever.  HENT: Negative for congestion, ear discharge and nosebleeds.   Eyes: Negative for blurred vision.  Respiratory: Positive for cough,  hemoptysis, sputum production and shortness of breath. Negative for wheezing.   Cardiovascular: Negative for chest pain, palpitations, orthopnea and claudication.  Gastrointestinal: Negative for abdominal pain, blood in stool, constipation, diarrhea, heartburn, melena, nausea and vomiting.  Genitourinary: Negative for dysuria, flank pain, frequency, hematuria and urgency.  Musculoskeletal: Negative for back pain, joint pain and myalgias.  Skin: Negative for rash.  Neurological: Negative for dizziness, tingling, focal weakness, seizures, weakness and headaches.  Endo/Heme/Allergies: Does not bruise/bleed easily.  Psychiatric/Behavioral: Negative for depression and suicidal ideas. The patient does not have insomnia.     No Known Allergies  Patient Active Problem List   Diagnosis Date Noted  . Acute respiratory failure with hypoxia ([place]) 01/09/2018     Past Medical History:  Diagnosis Date  . HTN (hypertension)   . Shingles      History reviewed. No pertinent surgical history.  Social History   Socioeconomic History  . Marital status: Divorced    Spouse name: Not on file  . Number of children: Not on file  . Years of education: Not on file  . Highest education level: Not on file  Occupational History  . Not on file  Social Needs  . Financial resource strain: Not on file  . Food insecurity:    Worry: Not on file    Inability: Not on file  . Transportation needs:    Medical: Not on file    Non-medical: Not on file  Tobacco Use  . Smoking status: Former Research scientist (life sciences)  .  Smokeless tobacco: Never Used  Substance and Sexual Activity  . Alcohol use: Not Currently  . Drug use: Yes    Comment: beer every day   . Sexual activity: Not on file  Lifestyle  . Physical activity:    Days per week: Not on file    Minutes per session: Not on file  . Stress: Not on file  Relationships  . Social connections:    Talks on phone: Not on file    Gets together: Not on file    Attends  religious service: Not on file    Active member of club or organization: Not on file    Attends meetings of clubs or organizations: Not on file    Relationship status: Not on file  . Intimate partner violence:    Fear of current or ex partner: Not on file    Emotionally abused: Not on file    Physically abused: Not on file    Forced sexual activity: Not on file  Other Topics Concern  . Not on file  Social History Narrative  . Not on file     No family history on file.   Current Facility-Administered Medications:  .  0.9 %  sodium chloride infusion, 250 mL, Intravenous, PRN, Demetrios Loll, MD .  0.9 %  sodium chloride infusion, , Intravenous, Continuous, Demetrios Loll, MD .  acetaminophen (TYLENOL) tablet 650 mg, 650 mg, Oral, Q6H PRN **OR** acetaminophen (TYLENOL) suppository 650 mg, 650 mg, Rectal, Q6H PRN, Demetrios Loll, MD .  albuterol (PROVENTIL) (2.5 MG/3ML) 0.083% nebulizer solution 2.5 mg, 2.5 mg, Nebulization, Q2H PRN, Demetrios Loll, MD .  bisacodyl (DULCOLAX) EC tablet 5 mg, 5 mg, Oral, Daily PRN, Demetrios Loll, MD .  budesonide (PULMICORT) nebulizer solution 0.5 mg, 0.5 mg, Nebulization, BID, Conforti, John, DO .  enoxaparin (LOVENOX) injection 40 mg, 40 mg, Subcutaneous, Q24H, Demetrios Loll, MD .  guaiFENesin (ROBITUSSIN) 100 MG/5ML solution 100 mg, 5 mL, Oral, Q4H PRN, Demetrios Loll, MD .  HYDROcodone-acetaminophen (NORCO/VICODIN) 5-325 MG per tablet 1-2 tablet, 1-2 tablet, Oral, Q4H PRN, Demetrios Loll, MD .  ipratropium-albuterol (DUONEB) 0.5-2.5 (3) MG/3ML nebulizer solution 3 mL, 3 mL, Nebulization, Q6H, Demetrios Loll, MD, 3 mL at 01/09/18 1512 .  methylPREDNISolone sodium succinate (SOLU-MEDROL) 125 mg/2 mL injection 60 mg, 60 mg, Intravenous, Q12H, Demetrios Loll, MD .  ondansetron [employer] tablet 4 mg, 4 mg, Oral, Q6H PRN **OR** ondansetron (ZOFRAN) injection 4 mg, 4 mg, Intravenous, Q6H PRN, Demetrios Loll, MD .  senna-docusate (Senokot-S) tablet 1 tablet, 1 tablet, Oral, QHS PRN, Demetrios Loll, MD .   sodium chloride flush (NS) 0.9 % injection 3 mL, 3 mL, Intravenous, Q12H, Demetrios Loll, MD .  sodium chloride flush (NS) 0.9 % injection 3 mL, 3 mL, Intravenous, PRN, Demetrios Loll, MD .  vancomycin (VANCOCIN) IVPB 1000 mg/200 mL premix, 1,000 mg, Intravenous, Once, [facility]   Physical exam:  Vitals:   01/09/18 1315 01/09/18 1330 01/09/18 1418 01/09/18 1512  BP: 113/70 126/73 136/81   Pulse: 72 71 76   Resp: (!) 26 (!) 26 (!) 22   Temp:      TempSrc:      SpO2: (!) 83% (S) (!) 88% (!) 83% (!) 86%  Weight:   119 lb 11.4 oz (54.3 kg)   Height:   5\' 9"  (1.753 m)    Physical Exam  Constitutional: He is oriented to person, place, and time.  Thin man who appears fatigued and short of breath.  He is on high flow oxygen  HENT:  Head: Normocephalic and atraumatic.  Eyes: Pupils are equal, round, and reactive to light. EOM are normal.  Neck: Normal range of motion.  Cardiovascular: Normal rate, regular rhythm and normal heart sounds.  Pulmonary/Chest: Effort normal and breath sounds normal.  Abdominal: Soft. Bowel sounds are normal.  Neurological: He is alert and oriented to person, place, and time.  Skin: Skin is warm and dry.       CMP Latest Ref Rng & Units 01/09/2018  Glucose 70 - 99 mg/dL 118(H)  BUN 8 - 23 mg/dL 18  Creatinine 0.61 - 1.24 mg/dL 1.08  Sodium 135 - 145 mmol/L 127(L)  Potassium 3.5 - 5.1 mmol/L 4.5  Chloride 98 - 111 mmol/L 93(L)  CO2 22 - 32 mmol/L 25  Calcium 8.9 - 10.3 mg/dL 9.7  Total Protein 6.5 - 8.1 g/dL 7.7  Total Bilirubin 0.3 - 1.2 mg/dL 1.3(H)  Alkaline Phos 38 - 126 U/L 110  AST 15 - 41 U/L 30  ALT 0 - 44 U/L 28   CBC Latest Ref Rng & Units 01/09/2018  WBC 3.8 - 10.6 K/uL 7.9  Hemoglobin 13.0 - 18.0 g/dL 12.4(L)  Hematocrit 40.0 - 52.0 % 35.3(L)  Platelets 150 - 440 K/uL 262    @IMAGES @  Ct Chest W Contrast  Result Date: 01/09/2018 CLINICAL DATA:  Shortness of breath, hemoptysis EXAM: CT CHEST WITH CONTRAST TECHNIQUE:  Multidetector CT imaging of the chest was performed during intravenous contrast administration. CONTRAST:  51mL OMNIPAQUE IOHEXOL 300 MG/ML  SOLN COMPARISON:  01/09/2018 plain film FINDINGS: Cardiovascular: Aneurysmal dilatation of the ascending thoracic aorta, 4.2 cm. Tortuosity of the thoracic aorta. Scattered atherosclerosis. Heart is borderline in size. Extensive coronary artery calcifications. Mediastinum/Nodes: No visible hilar, axillary or mediastinal adenopathy. Lungs/Pleura: There is a large mass in the right upper lobe which extends from the lateral pleural surface to the right hilum with extension into the right mainstem bronchus with partial obstruction of the right mainstem bronchus and bronchus intermedius. This mass measures approximately 7.8 by 6.0 x 5.3 cm and is most concerning for primary lung cancer. Extensive airspace disease throughout the right lower lobe and right middle lobe, likely postobstructive pneumonia. Airspace disease also surrounds the large mass in the right upper lobe, also likely pneumonia. Volume loss on the right. No confluent opacity or effusion on the left. Moderate emphysema. Elongated noncalcified pleural plaque noted posteriorly on the left with a craniocaudal length of 3.7 cm seen on image 103 of series 5, coronal imaging. Upper Abdomen: Imaging into the upper abdomen shows no acute findings. Musculoskeletal: Chest wall soft tissues are unremarkable. No acute bony abnormality. IMPRESSION: Large right upper lobe mass measuring up to 7.8 cm extending from the lateral pleural surface to the right hilum most compatible with primary lung cancer. Extensive airspace disease surrounds the mass in the right upper lobe and also noted in the right lower lobe and right middle lobe, likely pneumonia. The mass extends into the right mainstem bronchus with partial occlusion of the right mainstem bronchus and bronchus intermedius. Dilated gas filled esophagus. 4.2 cm ascending thoracic  aortic aneurysm. Recommend annual imaging followup by CTA or MRA. This recommendation follows 2010 ACCF/AHA/AATS/ACR/ASA/SCA/SCAI/SIR/STS/SVM Guidelines for the Diagnosis and Management of Patients with Thoracic Aortic Disease. Circulation. 2010; 121: T732-K025 Coronary artery disease. Aortic Atherosclerosis (ICD10-I70.0) and Emphysema (ICD10-J43.9). Electronically Signed   By: Rolm Baptise M.D.   On: 01/09/2018 10:50   Dg Chest Portable 1 View  Result Date: 01/09/2018 CLINICAL DATA:  77 year old with shortness of breath and hemoptysis. Former smoker. EXAM: PORTABLE CHEST 1 VIEW COMPARISON:  Chest radiograph report from 12/12/2002. Images not available. FINDINGS: There is marked volume loss in the right hemithorax with mediastinal shift towards the right. Poorly defined opacity in the right hilum and right upper lung. Some patchy densities at the right lung base. Hyperinflation of the left lung. Fullness along the right side of the mediastinum but difficult to characterize due to the mediastinal shift. Heart size appears to be within normal limits. Bone structures are unremarkable. IMPRESSION: Markedly abnormal chest radiograph with extensive volume loss in the right hemithorax and patchy disease throughout the right lung. There is a poorly defined large opacity in the right upper lung region and concerning for a lung mass. Recommend further characterization with a chest CT with IV contrast. Electronically Signed   By: Markus Daft M.D.   On: 01/09/2018 09:34    Assessment and plan- Patient is a 77 y.o. male presenting with cough, hemoptysis and weight loss found to have a large right upper lobe lung mass concerning for lung cancer  Recommend continuing acute symptomatic management including treatment of his pneumonia.  He is currently on high flow oxygen and hopefully his respiratory condition improves after IV antibiotics so he can come off high flow oxygen.  He needs lung biopsy ASAP which can be  accomplished by either CT-guided biopsy or bronchoscopy biopsy when the patient is hemodynamically stable.  He will need a PET CT scan as an outpatient.  Recommend getting an MRI of the brain with and without contrast to complete his staging work-up when patient is hemodynamically stable.  I will discuss further management based on his biopsy results.  Baseline CBC and CMP is unremarkable except for hyponatremia which may be secondary to dehydration  If he is unable to come off high flow oxygen his overall prognosis would be poor and palliative care input would help in further assistance of goals of care.   Visit Diagnosis 1. Lung mass   2. Community acquired pneumonia of right lung, unspecified part of lung   3. Hypoxia     Dr. Randa Evens, MD, MPH [facility] [phone] 01/09/2018  4:03 PM

## 2018-01-09 NOTE — ED Notes (Signed)
RT at bedside.Pt tried on high flow [place] vs. NRB. O2 sat in low-mid 80s with high flow [place]. MD aware.

## 2018-01-09 NOTE — Discharge Summary (Signed)
Physician Discharge Summary  Patient ID: Aaron Flores MRN: [id number] DOB/AGE: Jun 23, 1940 77 y.o.  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 Male with a PMH of Shingles and HTN, who [facility] ED [facility]/09/18 with c/o Shortness of Breath and associated Hemoptysis.  Pt reports that his SOB has been progressive for the last several months, which worsened today prompting him to seek evaluation at ED. He reports that he has been experiencing hemoptysis intermittingly for 2 weeks, with right Pleuritic chest pain worse with deep inspiration.  He denies fever/chills, sick contacts, swelling, or bone pain.  He does report weight loss of approximately 15 pounds in the last few months, having very poor po intake due to decreased appetite.  He reports that he worked in the [employer], was a smoker for approxametly 30 years, smoking 2 packs at his heaviest, quitting 5 years ago. Initial evaluation in the ED revealed: CXR w/ extensive opacities throughout the R lung and a RUL mass.  Na 127, WBC 7.9, BNP 310, Lactic 1.1, and Troponin<0.03.    Workup has included CT scan and chest x-ray. Chest x-ray was remarkable for tracheal deviation towards the right with right middle and right lower lobe opacities in a large lung mass. CT scan of the chest shows the same. There is an 7.8 cm lesion encroaching into the right mainstem bronchus with an exophytic component resulting in complete opacification of right mainstem/bronchus intermedius. I don't clearly see a right upper lobe orifice but there looks to be some retained right  upper lobe air. The right middle and right lower lobe are most certainly collapsed with complete luminal opacification. There is parenchymal densities in the right middle and right lower lung distribution which most likely results postobstructive atelectasis. Concerning to me is in the right upper lung zone there are lymphangitic prominence concerning for lymphangitic spread of malignancy. Patient's sodium is 127 Normal calcium.  Patient is going to need a multimodality approach to treatment of his malignancy. He will most likely need some form of airway debulking technique to include APC/ND YAG/possible rigid debulking/possible endoluminal stent/endoluminal radiation. He will also need combined radiation and chemotherapy approach. We don't have those capabilities at this facility and [facility] has been kind to accept this patient in transfer. Presently he is saturating 97% on high flow oxygen and is resting comfortably. He is on Solu-Medrol, bronchodilators and broad-spectrum anabiotic coverage.     Significant Diagnostic Studies: chest x-ray, CT scan of the chest and laboratory evaluation  Treatments: high flow oxygen, Solu-Medrol, bronchodilators and broad-spectrum antibiotic coverage  Discharge Exam: Blood pressure 137/79, pulse 76, temperature (!) 97 F (36.1 C), temperature source Axillary, resp. rate 20, height 5\' 9"  (1.753 m), weight 54.3 kg, SpO2 (!) 86 %.  Physical Exam General:  Acutely ill appearing male, sitting in bed, on 100% HFNC, w/ mild respiratory distress Neuro:  Awake, A&Ox4, Follows commands, no focal deficits HEENT:  Atraumatic, normocephalic, neck supple, No JVD Cardiovascular:  RRR, s1s2, no M/R/G, 2+ pulses throughout Lungs:  Tachypnea, even, mild assessory muscle use, clear on left, diminished R middle and R Lower lobes Abdomen:  Soft, nondistended, nontender, BS+ x4 Musculoskeletal:  No deformities,  normal bulk and tone Skin:  Warm/dry, no obvious rashes, lesions, or  ulcerations  Disposition: patient will be transferred to [place], DO Signed: [place] 01/09/2018, 4:09 PM

## 2018-01-09 NOTE — ED Triage Notes (Addendum)
Pt to ED via POV with c/o SOB x2-3wks. PT 77% on RA, no distress noted, RR even and unlabored, pt speaking in full sentences. MD at bedside . Non re breather applied

## 2018-01-09 NOTE — ED Notes (Signed)
Pt taken to ICU with RN and RT. ABCs intact. NAD

## 2018-01-09 NOTE — ED Notes (Signed)
RT called to bedside to place patient on BiPap. Upon arrival patient's sats were 72% on NRB. Patient was placed on Bipap per MD order. RN present at bedside.

## 2018-01-09 NOTE — ED Provider Notes (Signed)
[facility] Emergency Department Provider Note ____________________________________________   First MD Initiated Contact with Patient 01/09/18 [phone]     (approximate)  I have reviewed the triage vital signs and the nursing notes.   HISTORY  Chief Complaint Shortness of Breath    HPI Aaron Flores is a 77 y.o. male with a history of hypertension who presents with shortness of breath, gradual onset for the last several weeks, worsening today, and associated with hemoptysis.  The patient states that he has been short of breath for at least several weeks, but possibly longer.  He states that he is also had intermittent hemoptysis over the last several weeks.  He denies fever.  He reports some anterior chest pain.  The patient states that he works in [employer].  He states he once saw a doctor who told him that he had lung problems related to smoking and his work, but he never followed up with this.  He has been recently seen a doctor and getting medication for hypertension.  Past Medical History:  Diagnosis Date  . Shingles     Patient Active Problem List   Diagnosis Date Noted  . Acute respiratory failure with hypoxia ([place]) 01/09/2018    History reviewed. No pertinent surgical history.  Prior to Admission medications   Medication Sig Start Date End Date Taking? Authorizing Provider  lisinopril (PRINIVIL,ZESTRIL) 10 MG tablet Take 10 mg by mouth daily. 11/14/17  Yes [provider]  Multiple Vitamins-Minerals (CENTRUM SILVER 50+MEN) TABS Take 1 tablet by mouth daily.   Yes [provider]    Allergies Patient has no known allergies.  No family history on file.  Social History Social History   Tobacco Use  . Smoking status: Former Research scientist (life sciences)  . Smokeless tobacco: Never Used  Substance Use Topics  . Alcohol use: Not Currently  . Drug use: Yes    Comment: beer every day     Review of Systems  Constitutional: No  fever. Eyes: No redness. ENT: No sore throat. Cardiovascular: Positive for chest pain. Respiratory: Positive for shortness of breath. Gastrointestinal: No nausea, no vomiting.   Genitourinary: Negative for dysuria.  Musculoskeletal: Negative for back pain. Skin: Negative for rash. Neurological: Negative for headache.   ____________________________________________   PHYSICAL EXAM:  VITAL SIGNS: ED Triage Vitals  Enc Vitals Group     BP 01/09/18 0847 (!) 145/60     Pulse Rate 01/09/18 0847 88     Resp 01/09/18 0847 20     Temp 01/09/18 0911 (!) 96 F (35.6 C)     Temp Source 01/09/18 0847 Oral     SpO2 01/09/18 0847 (!) 54 %     Weight 01/09/18 0906 129 lb 11.2 oz (58.8 kg)     Height 01/09/18 0906 5\' 9"  (1.753 m)     Head Circumference --      Peak Flow --      Pain Score 01/09/18 0904 7     Pain Loc --      Pain Edu? --      Excl. in [place]? --     Constitutional: Alert and oriented.  Slightly uncomfortable appearing but in no acute distress. Eyes: Conjunctivae are normal.   Head: Atraumatic. Nose: No congestion/rhinnorhea. Mouth/Throat: Mucous membranes are moist.  Pharynx clear. Neck: Normal range of motion.  Cardiovascular: Normal rate, regular rhythm. Grossly normal heart sounds.  Good peripheral circulation. Respiratory: Somewhat increased respiratory effort.  No retractions.  Decreased breath sounds  bilaterally. Gastrointestinal: Soft and nontender. No distention.  Genitourinary: No flank tenderness. Musculoskeletal: No lower extremity edema.  Extremities warm and well perfused.  Neurologic:  Normal speech and language. No gross focal neurologic deficits are appreciated.  Skin:  Skin is warm and dry. No rash noted. Psychiatric: Mood and affect are normal. Speech and behavior are normal.  ____________________________________________   LABS (all labs ordered are listed, but only abnormal results are displayed)  Labs Reviewed  COMPREHENSIVE METABOLIC PANEL -  Abnormal; Notable for the following components:      Result Value   Sodium 127 (*)    Chloride 93 (*)    Glucose, Bld 118 (*)    Total Bilirubin 1.3 (*)    All other components within normal limits  BRAIN NATRIURETIC PEPTIDE - Abnormal; Notable for the following components:   B Natriuretic Peptide 310.0 (*)    All other components within normal limits  CBC WITH DIFFERENTIAL/PLATELET - Abnormal; Notable for the following components:   RBC 3.95 (*)    Hemoglobin 12.4 (*)    HCT 35.3 (*)    Neutro Abs 6.6 (*)    Lymphs Abs 0.3 (*)    All other components within normal limits  BLOOD GAS, VENOUS - Abnormal; Notable for the following components:   pCO2, Ven 34 (*)    Acid-base deficit 3.7 (*)    All other components within normal limits  TROPONIN I  LACTIC ACID, PLASMA  URINALYSIS, COMPLETE (UACMP) WITH MICROSCOPIC   ____________________________________________  EKG  ED ECG REPORT I, Arta Silence, the attending physician, personally viewed and interpreted this ECG.  Date: 01/09/2018 EKG Time: 0906 Rate: 78 Rhythm: normal sinus rhythm QRS Axis: normal Intervals: Prolonged PR ST/T Wave abnormalities: normal Narrative Interpretation: no evidence of acute ischemia  ____________________________________________  RADIOLOGY  CXR: Extensive right lung opacity, possible mass CT chest: Right upper lung mass with surrounding findings of likely pneumonia  ____________________________________________   PROCEDURES  Procedure(s) performed: No  Procedures  Critical Care performed: Yes  CRITICAL CARE Performed by: Arta Silence   Total critical care time: 60 minutes  Critical care time was exclusive of separately billable procedures and treating other patients.  Critical care was necessary to treat or prevent imminent or life-threatening deterioration.  Critical care was time spent personally by me on the following activities: development of treatment plan with  patient and/or surrogate as well as nursing, discussions with consultants, evaluation of patient's response to treatment, examination of patient, obtaining history from patient or surrogate, ordering and performing treatments and interventions, ordering and review of laboratory studies, ordering and review of radiographic studies, pulse oximetry and re-evaluation of patient's condition.  ____________________________________________   INITIAL IMPRESSION / ASSESSMENT AND PLAN / ED COURSE  Pertinent labs & imaging results that were available during my care of the patient were reviewed by me and considered in my medical decision making (see chart for details).  77 year old male with history of hypertension and shingles but otherwise no known PMH presents with shortness of breath over at least the last several weeks as well as intermittent hemoptysis.  I reviewed the past medical records in Epic but there are no prior records here.  On arrival, the patient had an O2 saturation as low as the 50s and was in the 70s on room air when I initially saw him.  However, the patient does not appear in acute respiratory distress.  He has slightly labored breathing but his hypoxia is out of proportion to his clinical  appearance.  Differential includes undiagnosed COPD, CHF, bronchitis, pneumonia, or PE.  O2 saturation is now in the low 90s on nonrebreather.  We will give nebs, obtain chest x-ray, labs, and reassess.  If no significant findings on chest x-ray will obtain CT chest to rule out PE.  ----------------------------------------- 12:42 PM on 01/09/2018 -----------------------------------------  Chest x-ray revealed extensive right lung opacities concerning for malignancy.  CT chest confirmed a large right upper lobe mass with surrounding changes consistent with pneumonia.  The patient remained relatively hypoxic on nonrebreather, with O2 saturation gradually decreased to the high 70s.  The patient's  work of breathing increased, and he appeared more tired.  We placed the patient on BiPAP, however he did worse with this, with the O2 saturations staying in the mid 60s and visible cyanosis.  The patient continued to appear tired.  I discussed intubation with him and with his family, and they agreed.  However, given the patient's likely cancer diagnosis I was concerned that if he is intubated he may not be able to be weaned off the ventilator.  We attempted high flow nasal cannula, and this seems to be doing the best for the patient.  His O2 saturations now in the mid 80s, his work of breathing is improved, and he is fully alert and conversant.  Based on shared decision making with the family, we will keep him on this for now in order to avoid intubation.  His work-up is otherwise unremarkable.  He has received empiric antibiotics for community acquired pneumonia.  I signed the patient out to the hospitalist Dr. Bridgett Larsson.  ____________________________________________   FINAL CLINICAL IMPRESSION(S) / ED DIAGNOSES  Final diagnoses:  Lung mass  Community acquired pneumonia of right lung, unspecified part of lung  Hypoxia      NEW MEDICATIONS STARTED DURING THIS VISIT:  New Prescriptions   No medications on file     Note:  This document was prepared using Dragon voice recognition software and may include unintentional dictation errors.    Arta Silence, MD 01/09/18 1246

## 2018-01-09 NOTE — Progress Notes (Signed)
Advanced Care Plan.  Purpose of Encounter: CODE STATUS Parties in Attendance: The patient, his son and daughter, me. Patient's Decisional Capacity: Yes. Medical Story: Aaron Flores  is a 77 y.o. male with a known history of hypertension.  The patient has had shortness of breath, cough and hematemesis for 1 months, worsening for the past 2 weeks and even worse for 2 days.    Is being admitted for acute respiratory failure with hypoxia due to lung mass and pneumonia.  I discussed with the patient about his current condition, very poor prognosis and CODE STATUS.  He want to be resuscitated and intubated for now.  Plan:  Code Status: Full code Time spent discussing advance care planning: 17 minutes.

## 2018-01-09 NOTE — ED Notes (Signed)
RT remains at bedside. MD at bedside. Pts O2 saturation better on NRB vs. BiPaP. Pt placed back on NRB.

## 2018-01-09 NOTE — ED Notes (Signed)
RT at bedside placing patient on bipap

## 2018-01-09 NOTE — ED Notes (Addendum)
Pt transported to CT at this time, per MD pt stable for transfer without RN . Pt appears to be in no resp distress. PT speaking in full sentences.

## 2018-01-09 NOTE — ED Triage Notes (Signed)
First nurse:  Says increased shortness of breath.  Coughing up blood.  Says has been short of breath and has been coughing up blood for long time, but today breathing is worse.

## 2018-01-09 NOTE — ED Notes (Signed)
Per MD request, patient taken off of BiPap, placed on NRB mask. SATS improved slightly on NRB. Per MD request, patient was then placed on [place] at 12Lpm 98% O2. Patient's SAT improved to mid 53s. RN present. MD aware.

## 2018-01-10 ENCOUNTER — Other Ambulatory Visit: Payer: Self-pay

## 2018-01-10 LAB — HIV ANTIBODY (ROUTINE TESTING W REFLEX): HIV SCREEN 4TH GENERATION: NONREACTIVE

## 2018-01-10 NOTE — Progress Notes (Signed)
[facility] ground transport has arrived to pick up patient for transport to [facility]. Dr. Mahlon Gammon, MD is accepting provider at [facility].  Patient is stable, alert.  No discomfort noted. Patient transported on BiPap.  Patient left with glasses, wallet and cell phone.  No belongings left in room.  Patient transported to [place] [facility].  EMTALA completed.

## 2018-01-10 NOTE — Progress Notes (Signed)
Patient waiting for [facility] ground transport to go to [facility].  Patient will be transported to [facility] on [address], Room [address] [place].  Patient alert and oriented. Denies pain at this time. Son and daughter leaving bedside at this time.  BiPap tolerated, however, patient prefers and asking for HFNC.  Continues on HFNC at this time.   No c/o pain or discomfort at this time.  Able to make needs known.  No respiratory distress noted. Will continue to monitor.

## 2018-01-11 LAB — LEGIONELLA PNEUMOPHILA SEROGP 1 UR AG: L. pneumophila Serogp 1 Ur Ag: NEGATIVE

## 2018-01-12 LAB — GLUCOSE, CAPILLARY: Glucose-Capillary: 111 mg/dL — ABNORMAL HIGH (ref 70–99)

## 2018-01-12 MED ORDER — ONDANSETRON HCL 4 MG/2ML IJ SOLN
4.00 | INTRAMUSCULAR | Status: DC
Start: ? — End: 2018-01-12

## 2018-01-12 MED ORDER — AMOXICILLIN-POT CLAVULANATE 875-125 MG PO TABS
875.00 | ORAL_TABLET | ORAL | Status: DC
Start: 2018-01-12 — End: 2018-01-12

## 2018-01-12 MED ORDER — LIDOCAINE HCL 1 % IJ SOLN
3.00 | INTRAMUSCULAR | Status: DC
Start: ? — End: 2018-01-12

## 2018-01-12 MED ORDER — GENERIC EXTERNAL MEDICATION
Status: DC
Start: ? — End: 2018-01-12

## 2018-01-12 MED ORDER — MUSCLE RUB 10-15 % EX CREA
TOPICAL_CREAM | CUTANEOUS | Status: DC
Start: ? — End: 2018-01-12

## 2018-01-12 MED ORDER — HYDROMORPHONE HCL 1 MG/ML IJ SOLN
.25 | INTRAMUSCULAR | Status: DC
Start: ? — End: 2018-01-12

## 2018-01-12 MED ORDER — ALBUTEROL SULFATE (5 MG/ML) 0.5% IN NEBU
2.50 | INHALATION_SOLUTION | RESPIRATORY_TRACT | Status: DC
Start: 2018-01-16 — End: 2018-01-12

## 2018-01-12 MED ORDER — GUAIFENESIN ER 600 MG PO TB12
600.00 | ORAL_TABLET | ORAL | Status: DC
Start: 2018-01-20 — End: 2018-01-12

## 2018-01-12 MED ORDER — AMLODIPINE BESYLATE 5 MG PO TABS
5.00 | ORAL_TABLET | ORAL | Status: DC
Start: 2018-01-21 — End: 2018-01-12

## 2018-01-14 LAB — CULTURE, BLOOD (ROUTINE X 2)
CULTURE: NO GROWTH
Culture: NO GROWTH
SPECIAL REQUESTS: ADEQUATE
Special Requests: ADEQUATE

## 2018-01-16 MED ORDER — SENNOSIDES-DOCUSATE SODIUM 8.6-50 MG PO TABS
2.00 | ORAL_TABLET | ORAL | Status: DC
Start: 2018-01-20 — End: 2018-01-16

## 2018-01-16 MED ORDER — GABAPENTIN 100 MG PO CAPS
100.00 | ORAL_CAPSULE | ORAL | Status: DC
Start: 2018-01-16 — End: 2018-01-16

## 2018-01-16 MED ORDER — ENOXAPARIN SODIUM 40 MG/0.4ML ~~LOC~~ SOLN
40.00 | SUBCUTANEOUS | Status: DC
Start: 2018-01-20 — End: 2018-01-16

## 2018-01-20 MED ORDER — SALINE NASAL SPRAY 0.65 % NA SOLN
1.00 | NASAL | Status: DC
Start: ? — End: 2018-01-20

## 2018-01-20 MED ORDER — ALBUTEROL SULFATE (5 MG/ML) 0.5% IN NEBU
2.50 | INHALATION_SOLUTION | RESPIRATORY_TRACT | Status: DC
Start: ? — End: 2018-01-20

## 2018-03-05 ENCOUNTER — Telehealth: Payer: Self-pay | Admitting: Internal Medicine

## 2018-03-05 ENCOUNTER — Encounter: Payer: Self-pay | Admitting: Internal Medicine

## 2018-03-05 NOTE — Telephone Encounter (Signed)
New referral received from Dr. Varney Biles at [facility] for Aaron Flores. Spoke to [place] from [place] and scheduled the pt to see Dr. Julien Nordmann on 10/22 at 215pm, arrive 30 minutes early for 1:45pm labs. [name] will notify the pt. Letter mailed.

## 2018-03-17 ENCOUNTER — Inpatient Hospital Stay: Payer: BLUE CROSS/BLUE SHIELD | Attending: Internal Medicine | Admitting: Internal Medicine

## 2018-03-17 ENCOUNTER — Inpatient Hospital Stay: Payer: BLUE CROSS/BLUE SHIELD

## 2018-03-17 ENCOUNTER — Telehealth: Payer: Self-pay | Admitting: *Deleted

## 2018-03-17 NOTE — Telephone Encounter (Signed)
Oncology Nurse Navigator Documentation  Oncology Nurse Navigator Flowsheets 03/17/2018  Navigator Location [facility]  Navigator Encounter Type Telephone/Mr. Seppala was a no show today.  I called and spoke to his son.  He updated me that patient passed away last week.  I offered my condolences.    Telephone Outgoing Call  Barriers/Navigation Needs Coordination of Care  Interventions Coordination of Care  Acuity Level 1  Time Spent with Patient 15

## 2018-03-27 DEATH — deceased

## 2019-12-15 IMAGING — CT CT CHEST W/ CM
2 of 3 series · 15 of 36 positions shown, 18 images · IV contrast (omnipaque)
Comparison: 01/09/2018 plain film

CLINICAL DATA: Shortness of breath, hemoptysis

EXAM:
CT CHEST WITH CONTRAST
TECHNIQUE: Multidetector CT imaging of the chest was performed during
intravenous contrast administration.
CONTRAST:  75mL OMNIPAQUE IOHEXOL 300 MG/ML  SOLN

[Series 2: axial st · axial · 0.72mm/px · z∈[-717,-473]mm · 12 of 144 slices shown, 15 images]
[im 11/144  mediastinal]
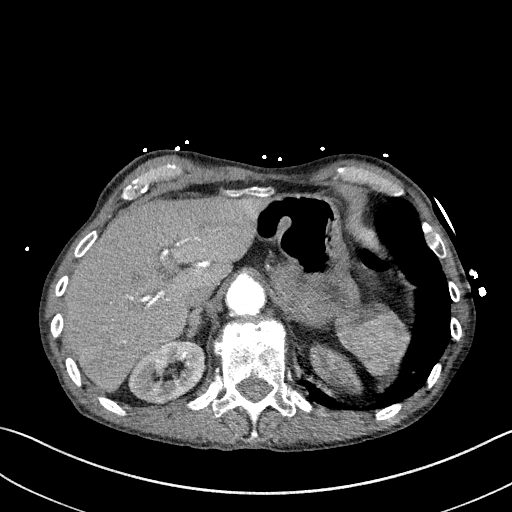
[im 11/144  lung]
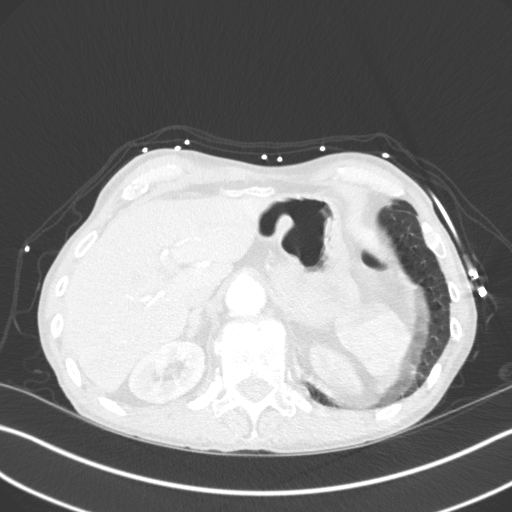
[im 22/144  lung]
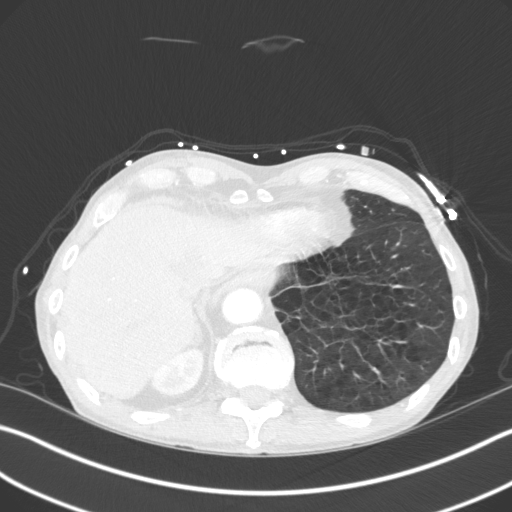
[im 32/144  lung]
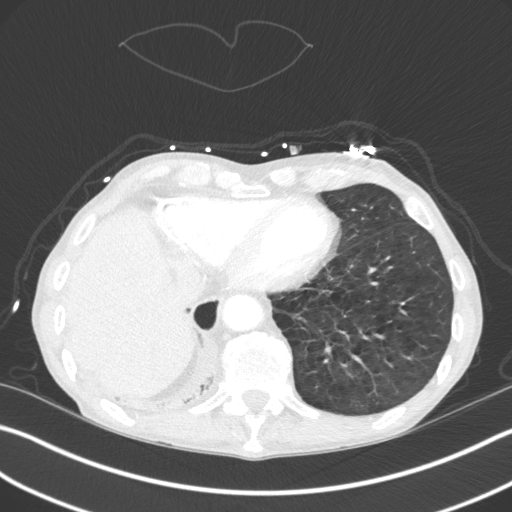
[im 43/144  lung]
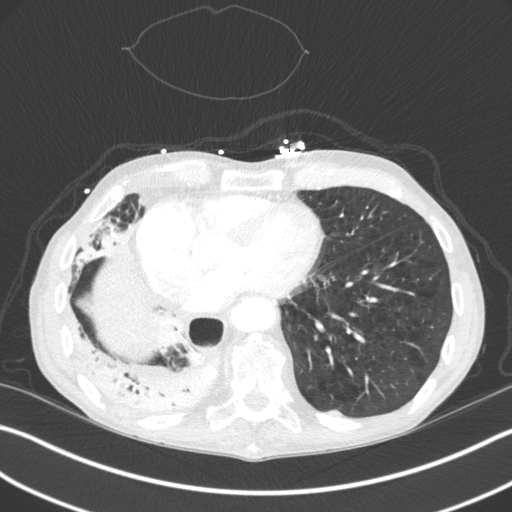
[im 53/144  mediastinal]
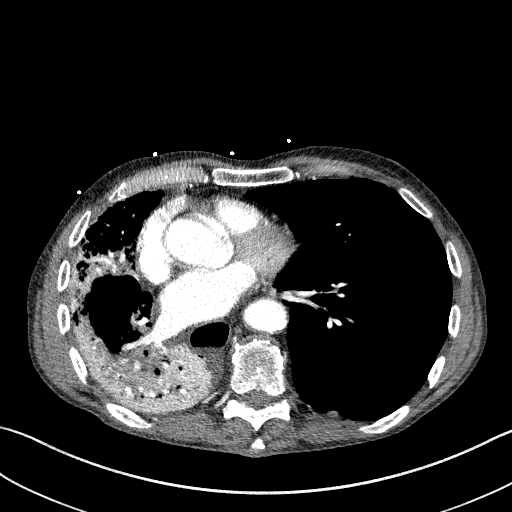
[im 53/144  lung]
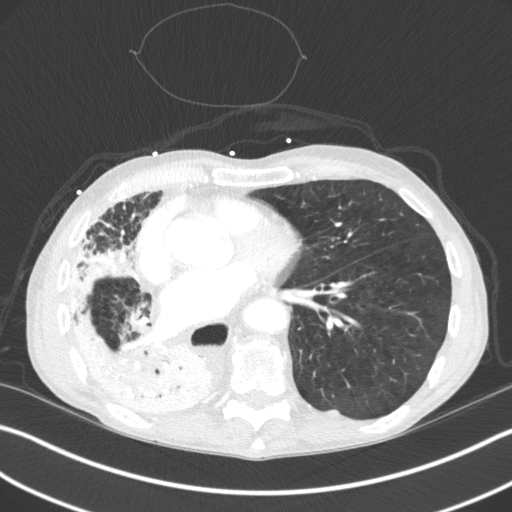
[im 64/144  lung]
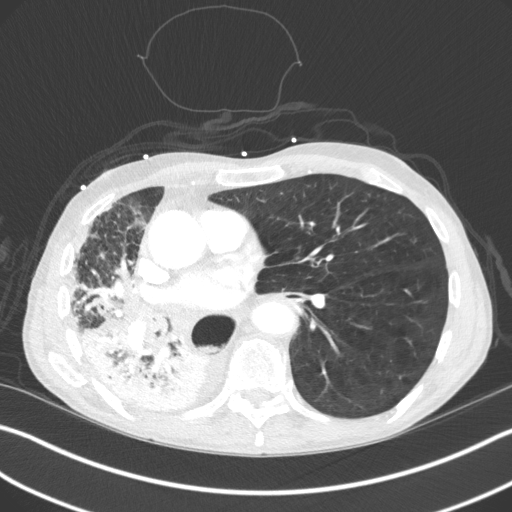
[im 80/144  lung]
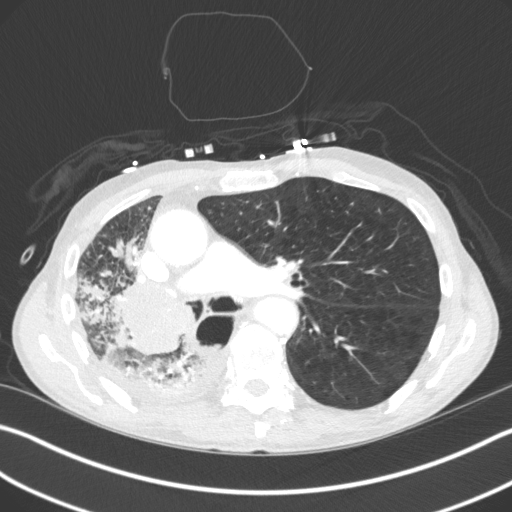
[im 91/144  lung]
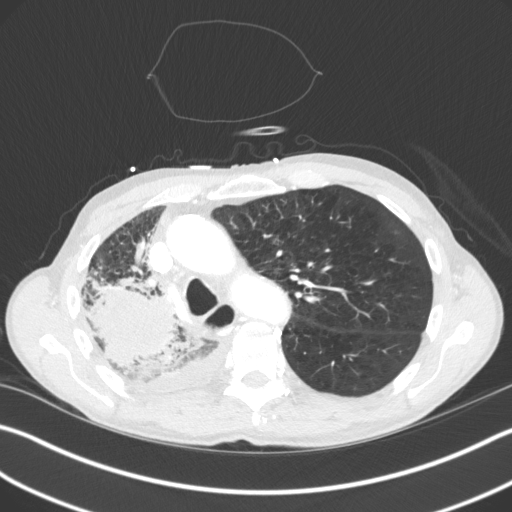
[im 101/144  mediastinal]
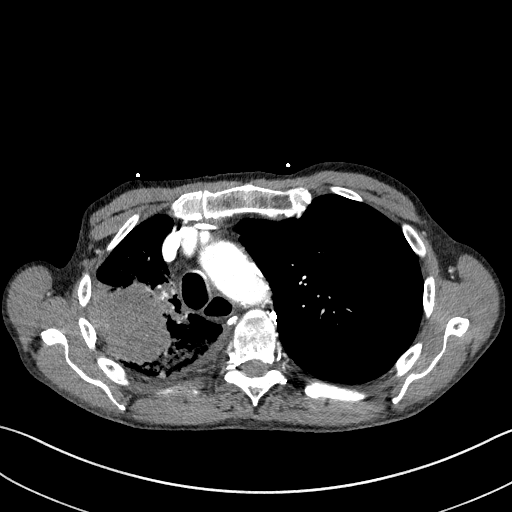
[im 101/144  lung]
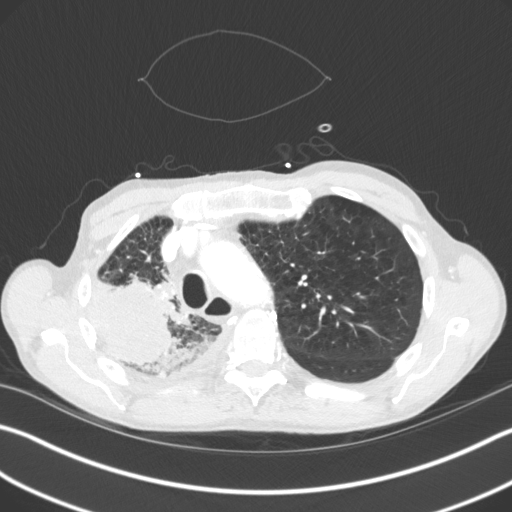
[im 112/144  lung]
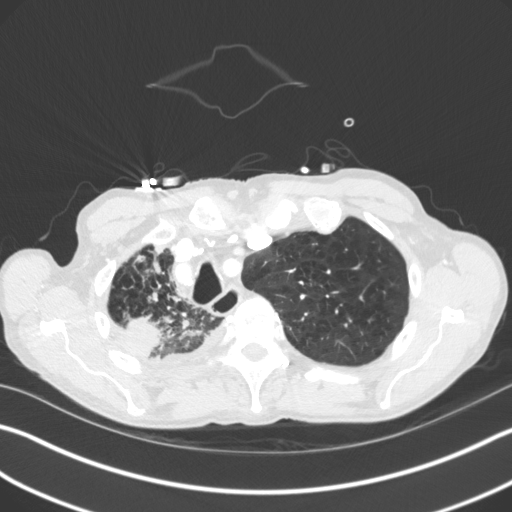
[im 122/144  lung]
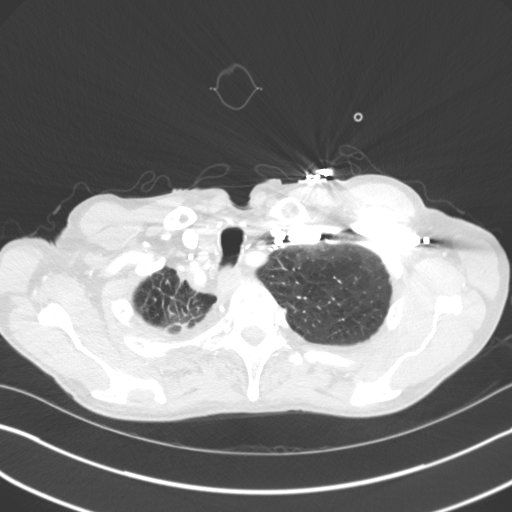
[im 133/144  lung]
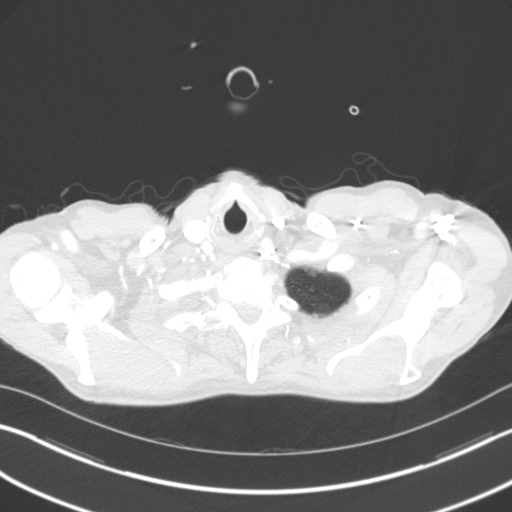

[Series 5: coronal · coronal · 0.61mm/px · 3 of 118 slices shown]
[im 24/118  lung]
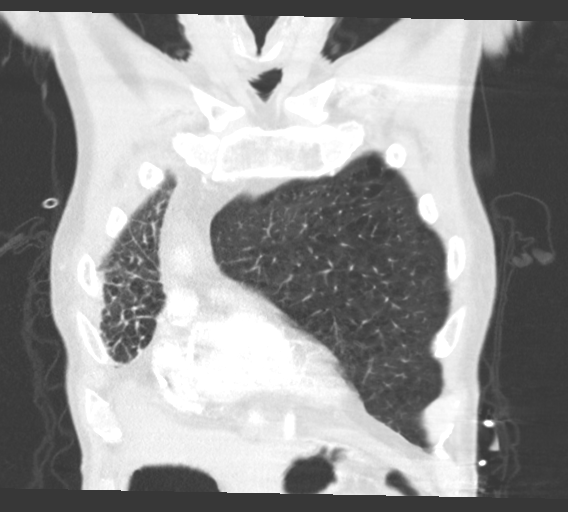
[im 47/118  lung]
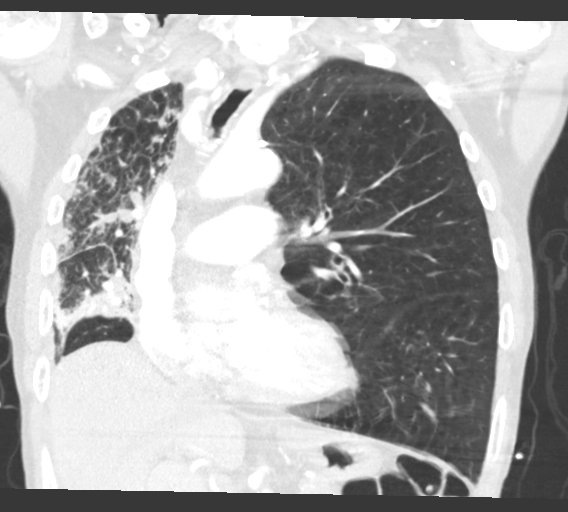
[im 71/118  lung]
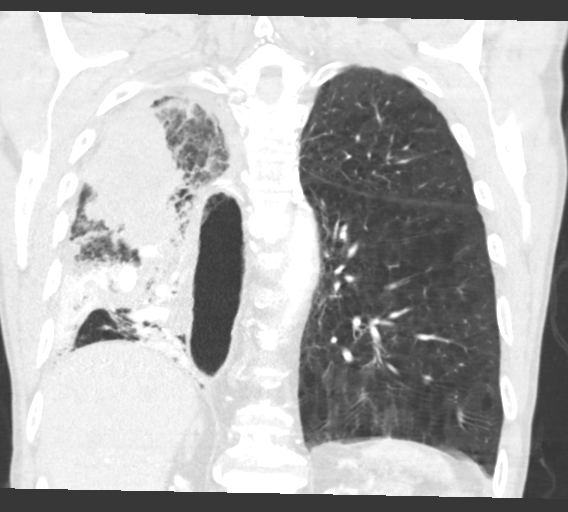

[15 of 36 positions shown; findings below may reference images not displayed]

FINDINGS: Cardiovascular: Aneurysmal dilatation of the ascending thoracic
aorta, 4.2 cm. Tortuosity of the thoracic aorta. Scattered
atherosclerosis. Heart is borderline in size. Extensive coronary
artery calcifications.

Mediastinum/Nodes: No visible hilar, axillary or mediastinal
adenopathy.

Lungs/Pleura: There is a large mass in the right upper lobe which
extends from the lateral pleural surface to the right hilum with
extension into the right mainstem bronchus with partial obstruction
of the right mainstem bronchus and bronchus intermedius. This mass
measures approximately 7.8 by 6.0 x 5.3 cm and is most concerning
for primary lung cancer. Extensive airspace disease throughout the
right lower lobe and right middle lobe, likely postobstructive
pneumonia. Airspace disease also surrounds the large mass in the
right upper lobe, also likely pneumonia. Volume loss on the right.
No confluent opacity or effusion on the left. Moderate emphysema.
Elongated noncalcified pleural plaque noted posteriorly on the left
with a craniocaudal length of 3.7 cm seen on image 103 of series 5,
coronal imaging.

Upper Abdomen: Imaging into the upper abdomen shows no acute
findings.

Musculoskeletal: Chest wall soft tissues are unremarkable. No acute
bony abnormality.
IMPRESSION: Large right upper lobe mass measuring up to 7.8 cm extending from
the lateral pleural surface to the right hilum most compatible with
primary lung cancer. Extensive airspace disease surrounds the mass
in the right upper lobe and also noted in the right lower lobe and
right middle lobe, likely pneumonia. The mass extends into the right
mainstem bronchus with partial occlusion of the right mainstem
bronchus and bronchus intermedius.

Dilated gas filled esophagus.

4.2 cm ascending thoracic aortic aneurysm. Recommend annual imaging
followup by CTA or MRA. This recommendation follows 0909
ACCF/AHA/AATS/ACR/ASA/SCA/RAYMUNDO/PILO/DAGER/LOOTLOVE Guidelines for the
Diagnosis and Management of Patients with Thoracic Aortic Disease.
Circulation. 0909; 121: [id number]

Coronary artery disease.

Aortic Atherosclerosis (0IK2L-NSO.O) and Emphysema (0IK2L-US2.B).
